# Patient Record
Sex: Male | Born: 1980 | Race: White | Hispanic: No | Marital: Married | Smoking: Former smoker
Health system: Southern US, Community
[De-identification: ages and names within clinical notes are randomized; demographics above are authoritative.]

## PROBLEM LIST (undated history)

## (undated) DIAGNOSIS — I729 Aneurysm of unspecified site: Secondary | ICD-10-CM

---

## 2006-08-11 ENCOUNTER — Encounter: Admission: RE | Admit: 2006-08-11 | Discharge: 2006-08-11 | Payer: Self-pay | Admitting: Family Medicine

## 2006-08-13 ENCOUNTER — Emergency Department (HOSPITAL_COMMUNITY): Admission: EM | Admit: 2006-08-13 | Discharge: 2006-08-14 | Payer: Self-pay | Admitting: Emergency Medicine

## 2007-12-11 IMAGING — CT CT HEAD W/O CM
1 series · 16 of 30 positions shown, 20 images · IV contrast (agent unspecified)
Comparison: 08/11/06 study.

CLINICAL DATA: Syncope.  History of right MCA aneurysm.
 HEAD CT WITHOUT CONTRAST:
TECHNIQUE: Contiguous axial images were obtained from the base of the skull through the vertex according to standard protocol without contrast.

[Series 2: brain · axial · 0.47mm/px · z∈[+134,+265]mm · 16 of 50 slices shown, 20 images]
[im 2/50  brain]
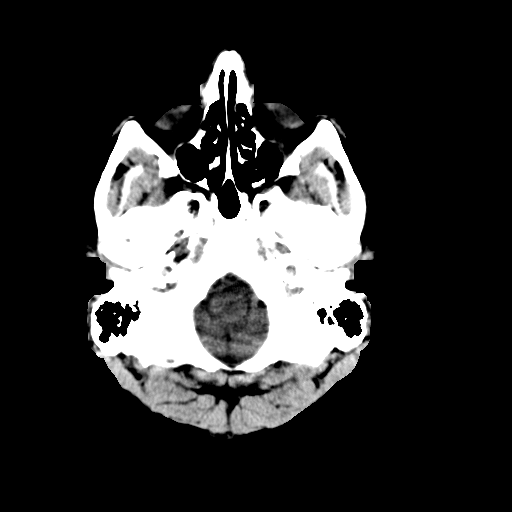
[im 2/50  bone]
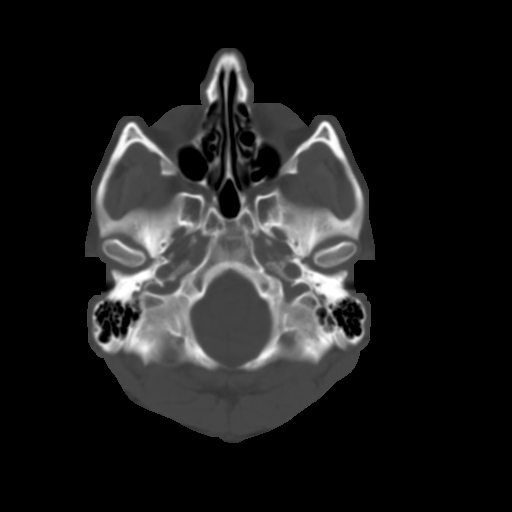
[im 6/50  brain]
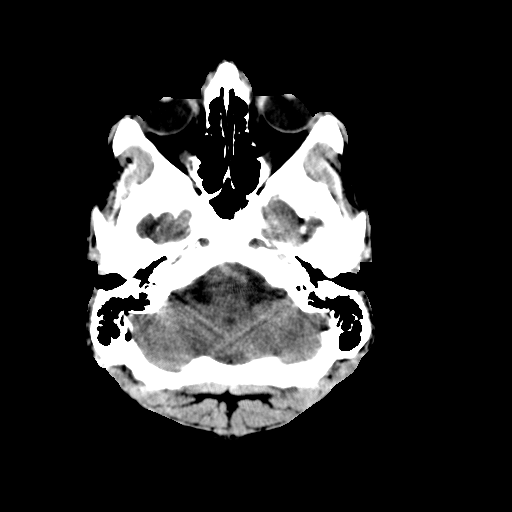
[im 9/50  brain]
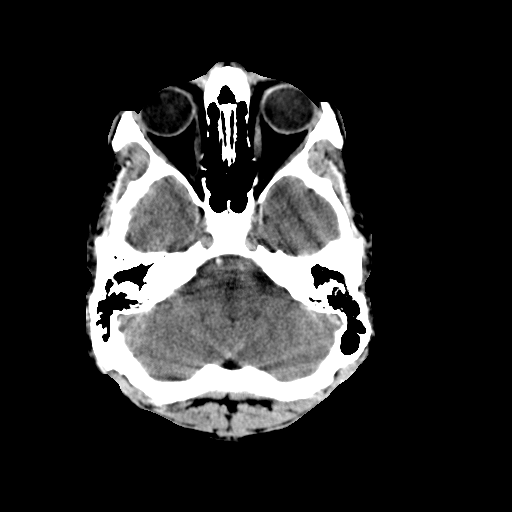
[im 12/50  brain]
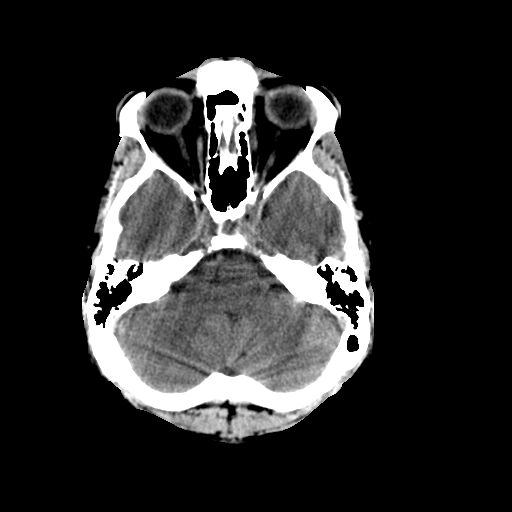
[im 14/50  brain]
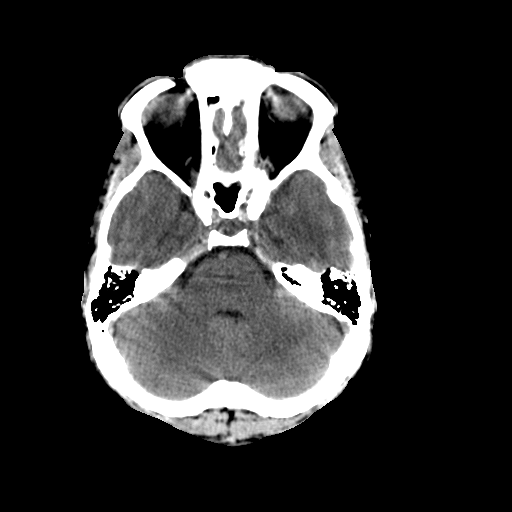
[im 14/50  bone]
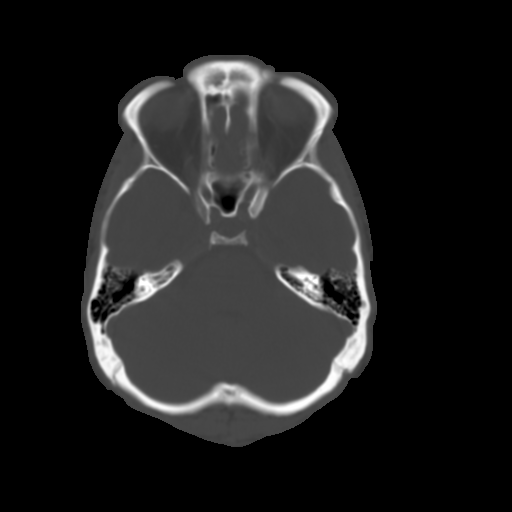
[im 17/50  brain]
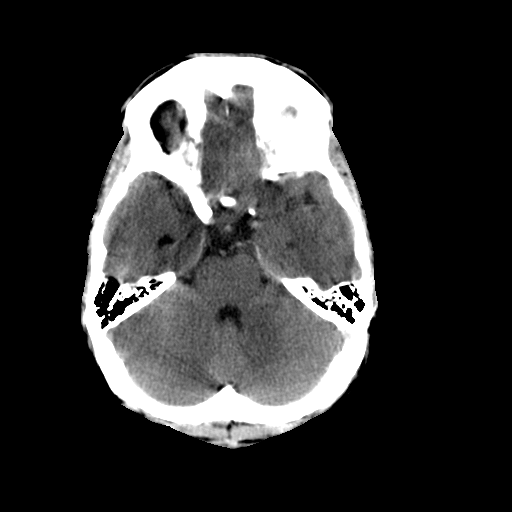
[im 21/50  brain]
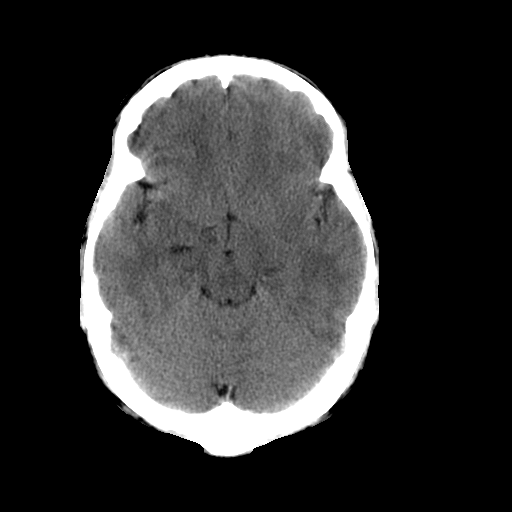
[im 24/50  brain]
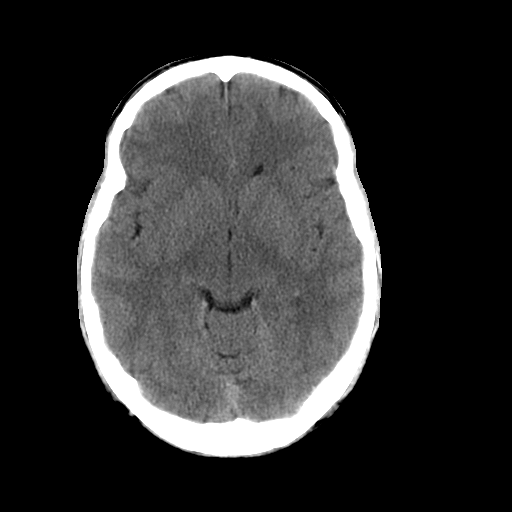
[im 26/50  brain]
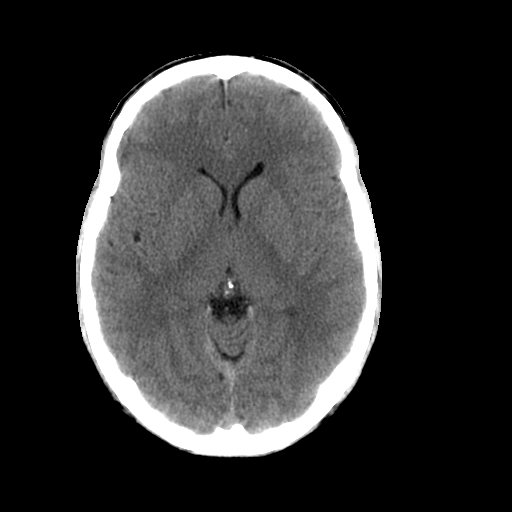
[im 26/50  bone]
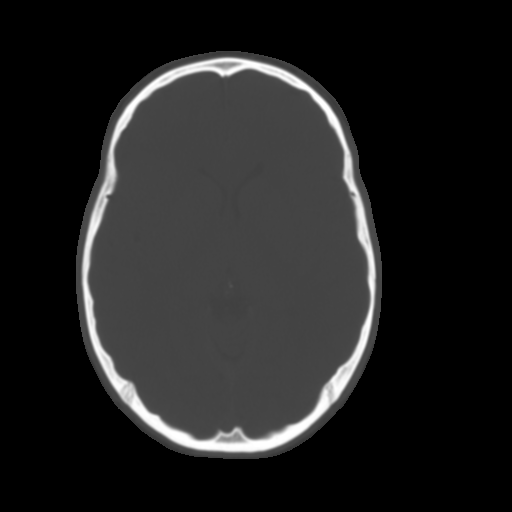
[im 29/50  brain]
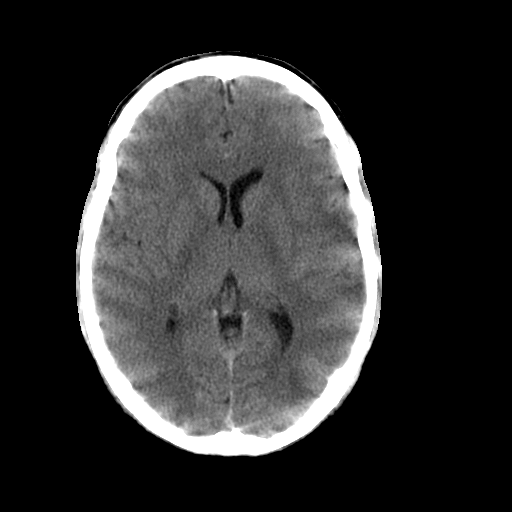
[im 33/50  brain]
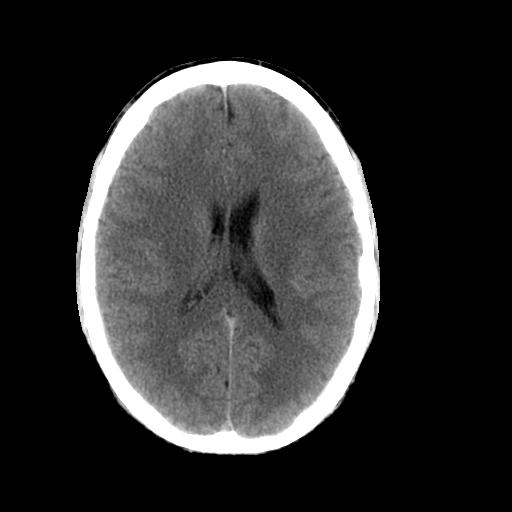
[im 36/50  brain]
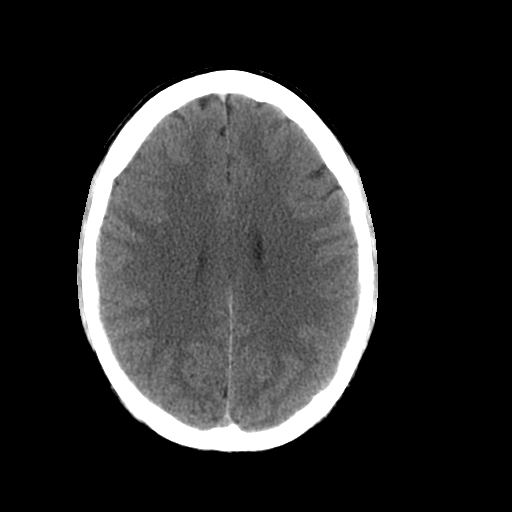
[im 38/50  brain]
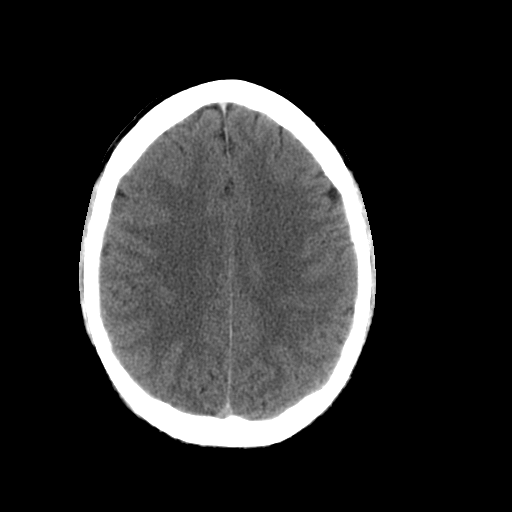
[im 38/50  bone]
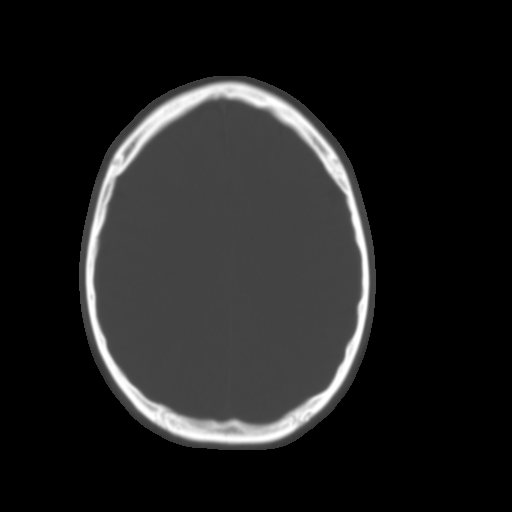
[im 41/50  brain]
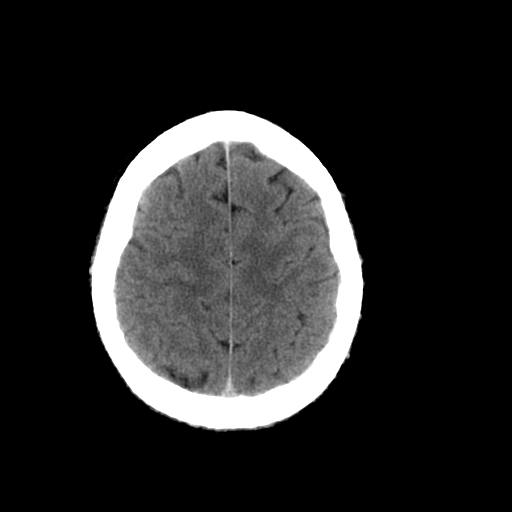
[im 44/50  brain]
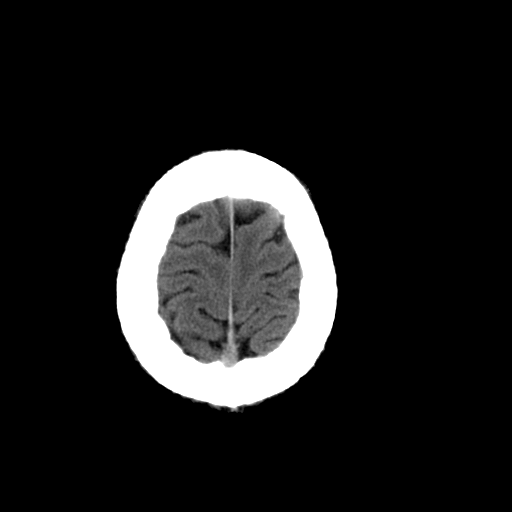
[im 48/50  brain]
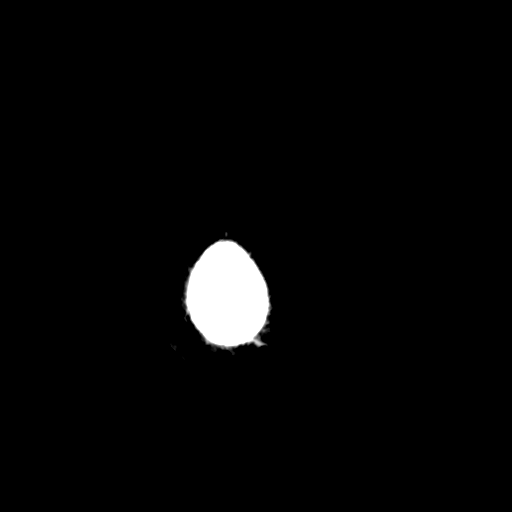

[16 of 30 positions shown; findings below may reference images not displayed]

FINDINGS: A right MCA aneurysm in the right sylvian fissure is unchanged since the prior study.  There is no evidence of hemorrhage.  No evidence of mass or mass effect, hydrocephalus, extraaxial fluid collection, midline shift, or infarct identified.  Please note that acute infarct may be occult on CT.
IMPRESSION: 1.  No evidence of acute intracranial abnormality. 
 2.  Right MCA aneurysm again identified, unchanged.

## 2015-10-18 ENCOUNTER — Emergency Department (HOSPITAL_COMMUNITY)
Admission: EM | Admit: 2015-10-18 | Discharge: 2015-10-18 | Disposition: A | Discharge status: Home / Self Care | Payer: Managed Care, Other (non HMO) | Attending: Emergency Medicine | Admitting: Emergency Medicine

## 2015-10-18 ENCOUNTER — Emergency Department (HOSPITAL_COMMUNITY): Payer: Managed Care, Other (non HMO)

## 2015-10-18 ENCOUNTER — Encounter (HOSPITAL_COMMUNITY): Payer: Self-pay | Admitting: Emergency Medicine

## 2015-10-18 DIAGNOSIS — Z8679 Personal history of other diseases of the circulatory system: Secondary | ICD-10-CM | POA: Insufficient documentation

## 2015-10-18 DIAGNOSIS — R42 Dizziness and giddiness: Secondary | ICD-10-CM | POA: Insufficient documentation

## 2015-10-18 DIAGNOSIS — R55 Syncope and collapse: Secondary | ICD-10-CM | POA: Diagnosis present

## 2015-10-18 DIAGNOSIS — F419 Anxiety disorder, unspecified: Secondary | ICD-10-CM | POA: Insufficient documentation

## 2015-10-18 DIAGNOSIS — R202 Paresthesia of skin: Secondary | ICD-10-CM | POA: Diagnosis not present

## 2015-10-18 DIAGNOSIS — Z87891 Personal history of nicotine dependence: Secondary | ICD-10-CM | POA: Insufficient documentation

## 2015-10-18 DIAGNOSIS — H9192 Unspecified hearing loss, left ear: Secondary | ICD-10-CM | POA: Diagnosis not present

## 2015-10-18 HISTORY — DX: Aneurysm of unspecified site: I72.9

## 2015-10-18 LAB — CBC
HCT: 44.5 % (ref 39.0–52.0)
Hemoglobin: 15.6 g/dL (ref 13.0–17.0)
MCH: 29.9 pg (ref 26.0–34.0)
MCHC: 35.1 g/dL (ref 30.0–36.0)
MCV: 85.2 fL (ref 78.0–100.0)
PLATELETS: 183 10*3/uL (ref 150–400)
RBC: 5.22 MIL/uL (ref 4.22–5.81)
RDW: 12.3 % (ref 11.5–15.5)
WBC: 5.8 10*3/uL (ref 4.0–10.5)

## 2015-10-18 LAB — COMPREHENSIVE METABOLIC PANEL
ALBUMIN: 4.1 g/dL (ref 3.5–5.0)
ALT: 30 U/L (ref 17–63)
AST: 22 U/L (ref 15–41)
Alkaline Phosphatase: 44 U/L (ref 38–126)
Anion gap: 13 (ref 5–15)
BUN: 11 mg/dL (ref 6–20)
CHLORIDE: 103 mmol/L (ref 101–111)
CO2: 23 mmol/L (ref 22–32)
Calcium: 9 mg/dL (ref 8.9–10.3)
Creatinine, Ser: 0.9 mg/dL (ref 0.61–1.24)
GFR calc Af Amer: >60 mL/min (ref >60)
Glucose, Bld: 122 mg/dL — ABNORMAL HIGH (ref 65–99)
POTASSIUM: 3.7 mmol/L (ref 3.5–5.1)
SODIUM: 139 mmol/L (ref 135–145)
TOTAL PROTEIN: 6.6 g/dL (ref 6.5–8.1)
Total Bilirubin: 0.7 mg/dL (ref 0.3–1.2)

## 2015-10-18 LAB — DIFFERENTIAL
BASOS ABS: 0 10*3/uL (ref 0.0–0.1)
BASOS PCT: 0 %
EOS ABS: 0.1 10*3/uL (ref 0.0–0.7)
Eosinophils Relative: 2 %
Lymphocytes Relative: 46 %
Lymphs Abs: 2.7 10*3/uL (ref 0.7–4.0)
MONOS PCT: 7 %
Monocytes Absolute: 0.4 10*3/uL (ref 0.1–1.0)
NEUTROS PCT: 45 %
Neutro Abs: 2.6 10*3/uL (ref 1.7–7.7)

## 2015-10-18 LAB — I-STAT CHEM 8, ED
BUN: 13 mg/dL (ref 6–20)
CALCIUM ION: 1.14 mmol/L (ref 1.12–1.23)
Chloride: 100 mmol/L — ABNORMAL LOW (ref 101–111)
Creatinine, Ser: 0.8 mg/dL (ref 0.61–1.24)
Glucose, Bld: 119 mg/dL — ABNORMAL HIGH (ref 65–99)
HCT: 49 % (ref 39.0–52.0)
Hemoglobin: 16.7 g/dL (ref 13.0–17.0)
Potassium: 3.5 mmol/L (ref 3.5–5.1)
SODIUM: 140 mmol/L (ref 135–145)
TCO2: 25 mmol/L (ref 0–100)

## 2015-10-18 LAB — I-STAT TROPONIN, ED: TROPONIN I, POC: 0 ng/mL (ref 0.00–0.08)

## 2015-10-18 LAB — APTT: APTT: 27 s (ref 24–37)

## 2015-10-18 LAB — PROTIME-INR
INR: 1.24 (ref 0.00–1.49)
Prothrombin Time: 15.7 seconds — ABNORMAL HIGH (ref 11.6–15.2)

## 2015-10-18 NOTE — Discharge Instructions (Signed)
It was our pleasure to provide your ER care today - we hope that you feel better.  Rest. Drink adequate fluids.  Follow up with neurologist in the coming week - see referral - call office tomorrow morning to arrange follow up appointment.  Return to ER right away if worse, new symptoms, severe dizziness, change in speech or vision, one-sided numbness or weakness, hearing loss, severe headache, other concern.      Dizziness Dizziness is a common problem. It is a feeling of unsteadiness or light-headedness. You may feel like you are about to faint. Dizziness can lead to injury if you stumble or fall. Anyone can become dizzy, but dizziness is more common in older adults. This condition can be caused by a number of things, including medicines, dehydration, or illness. HOME CARE INSTRUCTIONS Taking these steps may help with your condition: Eating and Drinking  Drink enough fluid to keep your urine clear or pale yellow. This helps to keep you from becoming dehydrated. Try to drink more clear fluids, such as water.  Do not drink alcohol.  Limit your caffeine intake if directed by your health care provider.  Limit your salt intake if directed by your health care provider. Activity  Avoid making quick movements.  Rise slowly from chairs and steady yourself until you feel okay.  In the morning, first sit up on the side of the bed. When you feel okay, stand slowly while you hold onto something until you know that your balance is fine.  Move your legs often if you need to stand in one place for a long time. Tighten and relax your muscles in your legs while you are standing.  Do not drive or operate heavy machinery if you feel dizzy.  Avoid bending down if you feel dizzy. Place items in your home so that they are easy for you to reach without leaning over. Lifestyle  Do not use any tobacco products, including cigarettes, chewing tobacco, or electronic cigarettes. If you need help quitting,  ask your health care provider.  Try to reduce your stress level, such as with yoga or meditation. Talk with your health care provider if you need help. General Instructions  Watch your dizziness for any changes.  Take medicines only as directed by your health care provider. Talk with your health care provider if you think that your dizziness is caused by a medicine that you are taking.  Tell a friend or a family member that you are feeling dizzy. If he or she notices any changes in your behavior, have this person call your health care provider.  Keep all follow-up visits as directed by your health care provider. This is important. SEEK MEDICAL CARE IF:  Your dizziness does not go away.  Your dizziness or light-headedness gets worse.  You feel nauseous.  You have reduced hearing.  You have new symptoms.  You are unsteady on your feet or you feel like the room is spinning. SEEK IMMEDIATE MEDICAL CARE IF:  You vomit or have diarrhea and are unable to eat or drink anything.  You have problems talking, walking, swallowing, or using your arms, hands, or legs.  You feel generally weak.  You are not thinking clearly or you have trouble forming sentences. It may take a friend or family member to notice this.  You have chest pain, abdominal pain, shortness of breath, or sweating.  Your vision changes.  You notice any bleeding.  You have a headache.  You have neck pain or  a stiff neck.  You have a fever.   This information is not intended to replace advice given to you by your health care provider. Make sure you discuss any questions you have with your health care provider.   Document Released: 12/24/2000 Document Revised: 11/14/2014 Document Reviewed: 06/26/2014 Elsevier Interactive Patient Education 2016 [employer]    Paresthesia Paresthesia is an abnormal burning or prickling sensation. This sensation is generally felt in the hands, arms, legs, or feet. However, it  may occur in any part of the body. Usually, it is not painful. The feeling may be described as:  Tingling or numbness.  Pins and needles.  Skin crawling.  Buzzing.  Limbs falling asleep.  Itching. Most people experience temporary (transient) paresthesia at some time in their lives. Paresthesia may occur when you breathe too quickly (hyperventilation). It can also occur without any apparent cause. Commonly, paresthesia occurs when pressure is placed on a nerve. The sensation quickly goes away after the pressure is removed. For some people, however, paresthesia is a long-lasting (chronic) condition that is caused by an underlying disorder. If you continue to have paresthesia, you may need further medical evaluation. HOME CARE INSTRUCTIONS Watch your condition for any changes. Taking the following actions may help to lessen any discomfort that you are feeling:  Avoid drinking alcohol.  Try acupuncture or massage to help relieve your symptoms.  Keep all follow-up visits as directed by your health care provider. This is important. SEEK MEDICAL CARE IF:  You continue to have episodes of paresthesia.  Your burning or prickling feeling gets worse when you walk.  You have pain, cramps, or dizziness.  You develop a rash. SEEK IMMEDIATE MEDICAL CARE IF:  You feel weak.  You have trouble walking or moving.  You have problems with speech, understanding, or vision.  You feel confused.  You cannot control your bladder or bowel movements.  You have numbness after an injury.  You faint.   This information is not intended to replace advice given to you by your health care provider. Make sure you discuss any questions you have with your health care provider.   Document Released: 06/20/2002 Document Revised: 11/14/2014 Document Reviewed: 06/26/2014 Elsevier Interactive Patient Education [employer]

## 2015-10-18 NOTE — ED Notes (Signed)
Patient not in room yet 

## 2015-10-18 NOTE — ED Notes (Signed)
Pt reports that he was watching television when he started to get dizzy, the left side of his face went numb and he lost hearing in his left ear. Pt reports that symptoms resolved. Pt alert x4 @ this time.

## 2015-10-18 NOTE — ED Provider Notes (Signed)
CSN: [id number]     Arrival date & time 10/18/15  1642 History   First MD Initiated Contact with Patient 10/18/15 2020     Chief Complaint  Patient presents with  . Near Syncope  . Numbness     (Consider location/radiation/quality/duration/timing/severity/associated sxs/prior Treatment) Patient is a 35 y.o. male presenting with near-syncope. The history is provided by the patient.  Near Syncope Pertinent negatives include no chest pain, no abdominal pain, no headaches and no shortness of breath.  Patient indicates was sitting, watching tv, when had onset dizziness. Described as room spinning sensation.  Pt notes remote hx vertigo.  Patient then starts talking about hx anxiety attacks, and appears very anxious, fidgety, visibily shaking on stretcher.  Then decreased hearing left ear, and face felt numb tingling.  Patient indicates he became aware he was breathing was very fast, and tried to pace around, and do breathing exercises, as he has done with prior anxiety attacks. Symptoms improved w slowing breathing.  Patient indicates lasted 2-3 minutes, then resolve completely. States in car had another episode lasting about 1 minute.  Currently, no numbness, no dizziness, no hearing loss or tinnitus.   Past Medical History  Diagnosis Date  . Aneurysm (HCC)     clipped ten plus years ago.    History reviewed. No pertinent past surgical history. No family history on file. Social History  Substance Use Topics  . Smoking status: Former Games developer  . Smokeless tobacco: None  . Alcohol Use: No    Review of Systems  Constitutional: Negative for fever and chills.  HENT: Negative for congestion, sore throat and tinnitus.   Eyes: Negative for visual disturbance.  Respiratory: Negative for shortness of breath.   Cardiovascular: Positive for near-syncope. Negative for chest pain.  Gastrointestinal: Negative for vomiting and abdominal pain.  Genitourinary: Negative for dysuria and flank pain.   Musculoskeletal: Negative for back pain and neck pain.  Skin: Negative for rash.  Neurological: Negative for speech difficulty, weakness and headaches.  Hematological: Does not bruise/bleed easily.  Psychiatric/Behavioral: Negative for confusion.      Allergies  Review of patient's allergies indicates no known allergies.  Home Medications   Prior to Admission medications   Not on File   BP 138/84 mmHg  Pulse 82  Temp(Src) 97.7 F (36.5 C) (Oral)  Resp 18  SpO2 96% Physical Exam  Constitutional: He is oriented to person, place, and time. He appears well-developed and well-nourished. No distress.  HENT:  Head: Atraumatic.  Mouth/Throat: Oropharynx is clear and moist.  Eyes: Conjunctivae and EOM are normal. Pupils are equal, round, and reactive to light. No scleral icterus.  Neck: Neck supple. No tracheal deviation present.  No bruits  Cardiovascular: Normal rate, regular rhythm, normal heart sounds and intact distal pulses.  Exam reveals no gallop and no friction rub.   No murmur heard. Pulmonary/Chest: Effort normal and breath sounds normal. No accessory muscle usage. No respiratory distress.  Abdominal: Soft. Bowel sounds are normal. He exhibits no distension. There is no tenderness.  Genitourinary:  No cva tenderness  Musculoskeletal: Normal range of motion. He exhibits no edema.  Neurological: He is alert and oriented to person, place, and time. No cranial nerve deficit.  Motor intact bil. stre 5/5. sens grossly intact. No pronator drift, steady gait.     Skin: Skin is warm and dry. No rash noted. He is not diaphoretic.  Psychiatric:  Anxious.   Nursing note and vitals reviewed.   ED Course  Procedures (  including critical care time) Labs Review   Results for orders placed or performed during the hospital encounter of 10/18/15  Protime-INR  Result Value Ref Range   Prothrombin Time 15.7 (H) 11.6 - 15.2 seconds   INR 1.24 0.00 - 1.49  APTT  Result Value  Ref Range   aPTT 27 24 - 37 seconds  CBC  Result Value Ref Range   WBC 5.8 4.0 - 10.5 K/uL   RBC 5.22 4.22 - 5.81 MIL/uL   Hemoglobin 15.6 13.0 - 17.0 g/dL   HCT 59.5 63.8 - 75.6 %   MCV 85.2 78.0 - 100.0 fL   MCH 29.9 26.0 - 34.0 pg   MCHC 35.1 30.0 - 36.0 g/dL   RDW 43.3 29.5 - 18.8 %   Platelets 183 150 - 400 K/uL  Differential  Result Value Ref Range   Neutrophils Relative % 45 %   Neutro Abs 2.6 1.7 - 7.7 K/uL   Lymphocytes Relative 46 %   Lymphs Abs 2.7 0.7 - 4.0 K/uL   Monocytes Relative 7 %   Monocytes Absolute 0.4 0.1 - 1.0 K/uL   Eosinophils Relative 2 %   Eosinophils Absolute 0.1 0.0 - 0.7 K/uL   Basophils Relative 0 %   Basophils Absolute 0.0 0.0 - 0.1 K/uL  Comprehensive metabolic panel  Result Value Ref Range   Sodium 139 135 - 145 mmol/L   Potassium 3.7 3.5 - 5.1 mmol/L   Chloride 103 101 - 111 mmol/L   CO2 23 22 - 32 mmol/L   Glucose, Bld 122 (H) 65 - 99 mg/dL   BUN 11 6 - 20 mg/dL   Creatinine, Ser 4.16 0.61 - 1.24 mg/dL   Calcium 9.0 8.9 - 60.6 mg/dL   Total Protein 6.6 6.5 - 8.1 g/dL   Albumin 4.1 3.5 - 5.0 g/dL   AST 22 15 - 41 U/L   ALT 30 17 - 63 U/L   Alkaline Phosphatase 44 38 - 126 U/L   Total Bilirubin 0.7 0.3 - 1.2 mg/dL   GFR calc non Af Amer >60 >60 mL/min   GFR calc Af Amer >60 >60 mL/min   Anion gap 13 5 - 15  I-stat troponin, ED (not at [facility], [facility]  Result Value Ref Range   Troponin i, poc 0.00 0.00 - 0.08 ng/mL   Comment 3          I-Stat Chem 8, ED  (not at [facility], [facility])  Result Value Ref Range   Sodium 140 135 - 145 mmol/L   Potassium 3.5 3.5 - 5.1 mmol/L   Chloride 100 (L) 101 - 111 mmol/L   BUN 13 6 - 20 mg/dL   Creatinine, Ser 3.01 0.61 - 1.24 mg/dL   Glucose, Bld 601 (H) 65 - 99 mg/dL   Calcium, Ion 0.93 2.35 - 1.23 mmol/L   TCO2 25 0 - 100 mmol/L   Hemoglobin 16.7 13.0 - 17.0 g/dL   HCT 57.3 22.0 - 25.4 %   Ct Head Wo Contrast  10/18/2015  CLINICAL DATA:  Three episodes of dizziness today, LEFT facial numbness, no  hearing in LEFT ear, prior aneurysm clipping, former smoker EXAM: CT HEAD WITHOUT CONTRAST TECHNIQUE: Contiguous axial images were obtained from the base of the skull through the vertex without intravenous contrast. COMPARISON:  08/13/2006 FINDINGS: Beam hardening artifacts from aneurysm clip at RIGHT sylvian fissure. Normal ventricular morphology. No midline shift or mass effect. Otherwise normal appearance of brain parenchyma. No intracranial hemorrhage, mass lesion, or  evidence acute infarction. No extra-axial fluid collections. Postsurgical changes from RIGHT frontotemporal craniotomy. No acute bone or sinus abnormalities. IMPRESSION: Postsurgical changes from aneurysm clipping at RIGHT sylvian fissure. No acute intracranial abnormalities. Electronically Signed   By: Ulyses SouthwardMark  Boles M.D.   On: 10/18/2015 17:38       I have personally reviewed and evaluated these images and lab results as part of my medical decision-making.   EKG Interpretation   Date/Time:  Thursday October 18 2015 16:50:42 EDT Ventricular Rate:  76 PR Interval:  166 QRS Duration: 82 QT Interval:  356 QTC Calculation: 400 R Axis:   82 Text Interpretation:  Normal sinus rhythm with sinus arrhythmia Normal ECG  No significant change since last tracing Confirmed by Denton LankSTEINL  MD, Caryn BeeKEVIN  ([id number]) on 10/18/2015 8:55:21 PM      MDM   Labs.  Reviewed nursing notes and prior charts for additional history.   Symptoms have completely resolved.  Patient denies numbness/weakness, or any loss of normal functional ability. No headache.  Patient remains asymptomatic, and currently appears stable for d/c.  Recommend close neurology f/u - will provide referral - return precautions provided.        Cathren LaineKevin Mackenize Delgadillo, MD 10/18/15 2132

## 2015-10-18 NOTE — ED Notes (Signed)
Dr. Steinl at the bedside.  

## 2017-02-14 IMAGING — CT CT HEAD W/O CM
1 series · 16 of 30 positions shown, 20 images · non-contrast
Comparison: 08/13/2006

CLINICAL DATA: Three episodes of dizziness today, LEFT facial
numbness, no hearing in LEFT ear, prior aneurysm clipping, former
smoker

EXAM:
CT HEAD WITHOUT CONTRAST
TECHNIQUE: Contiguous axial images were obtained from the base of the skull
through the vertex without intravenous contrast.

[Series 2: head 5.0 h30s · axial · 0.46mm/px · z∈[-137,+8]mm · 16 of 33 slices shown, 20 images]
[im 2/33  brain]
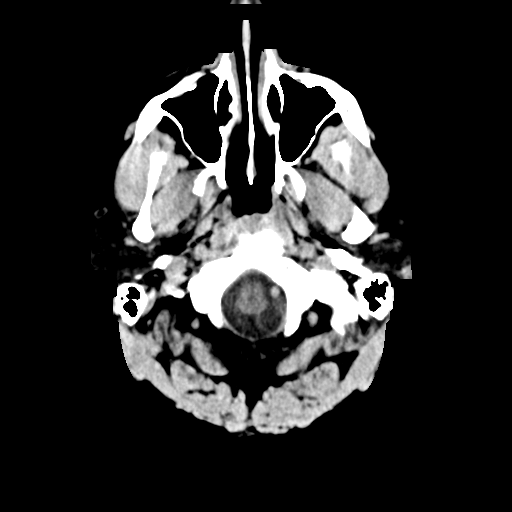
[im 2/33  bone]
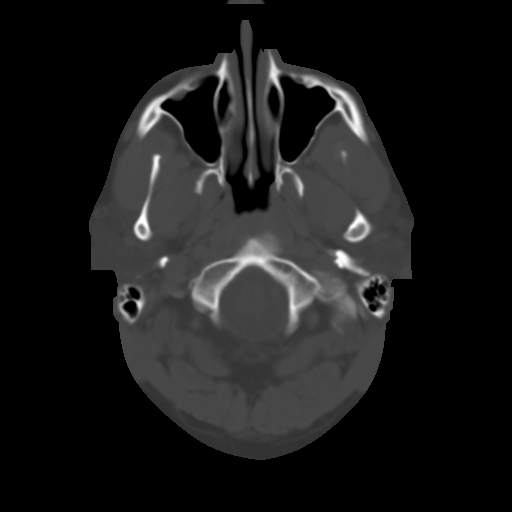
[im 4/33  brain]
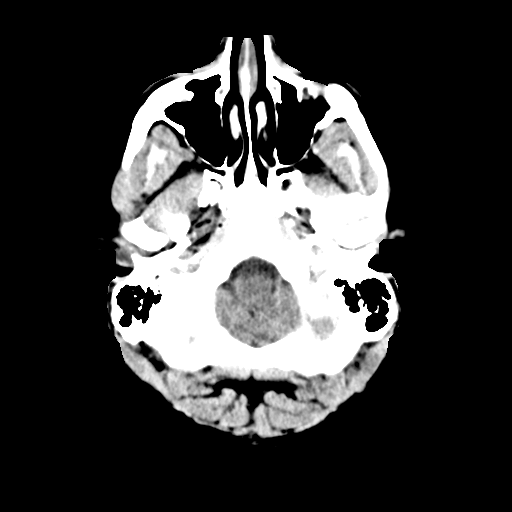
[im 6/33  brain]
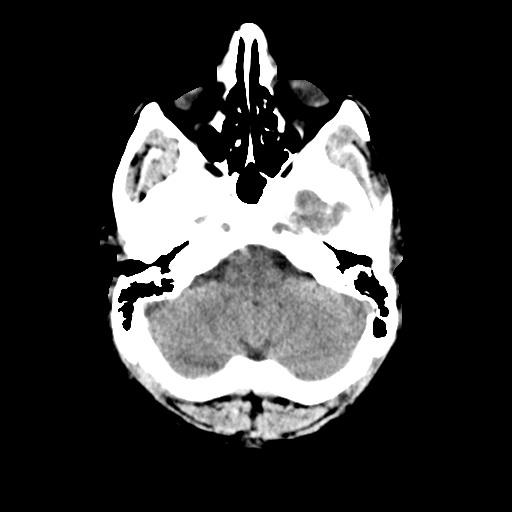
[im 8/33  brain]
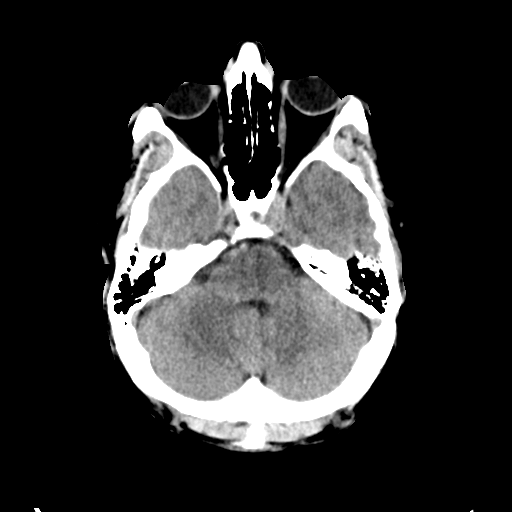
[im 9/33  brain]
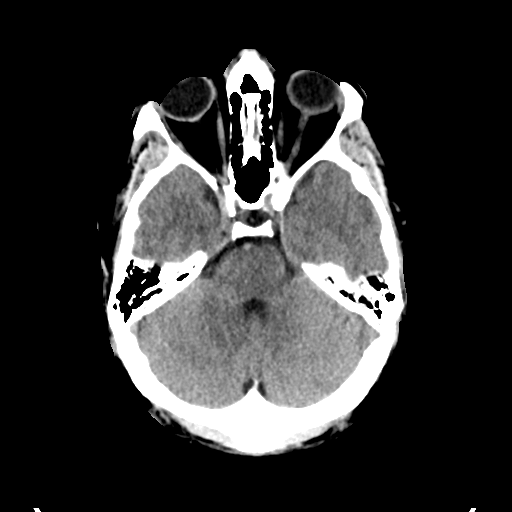
[im 9/33  bone]
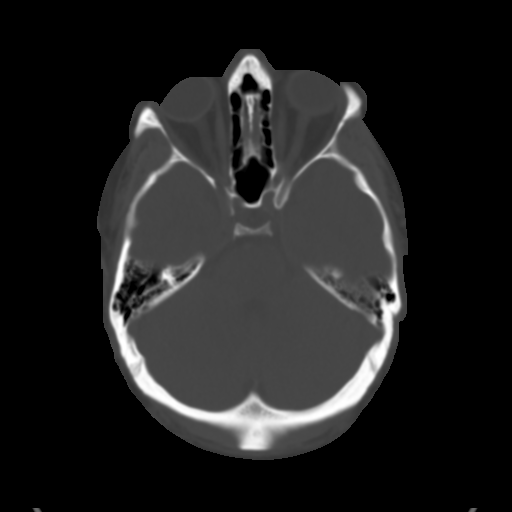
[im 12/33  brain]
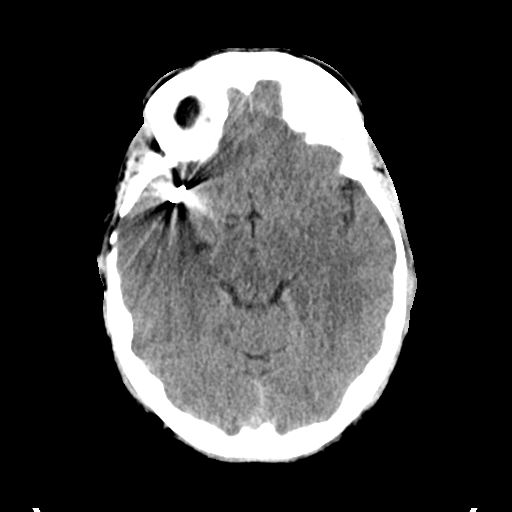
[im 14/33  brain]
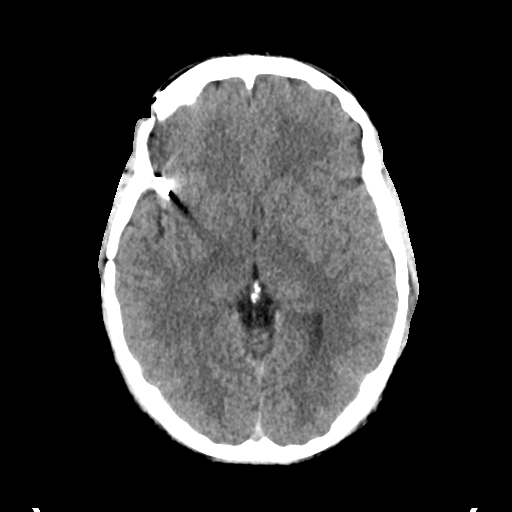
[im 16/33  brain]
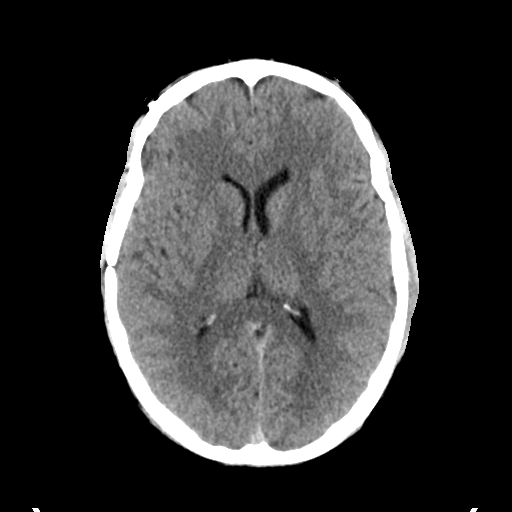
[im 17/33  brain]
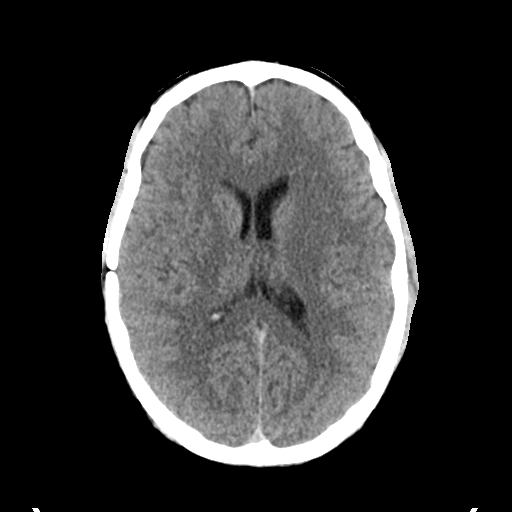
[im 17/33  bone]
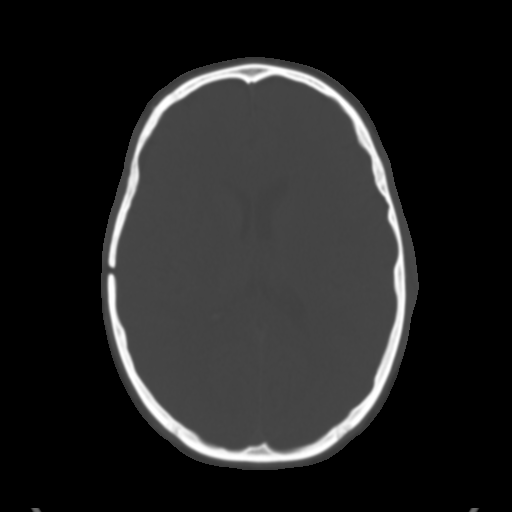
[im 19/33  brain]
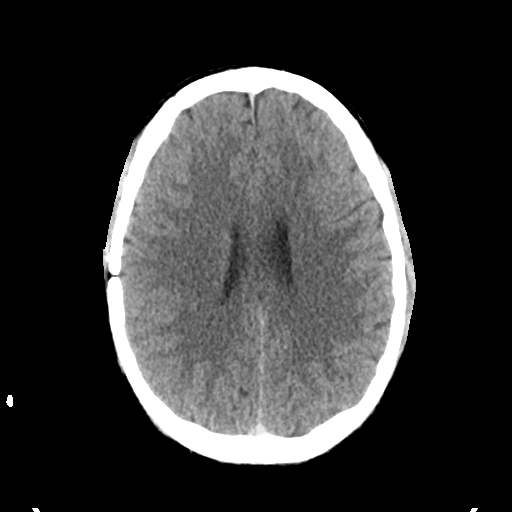
[im 21/33  brain]
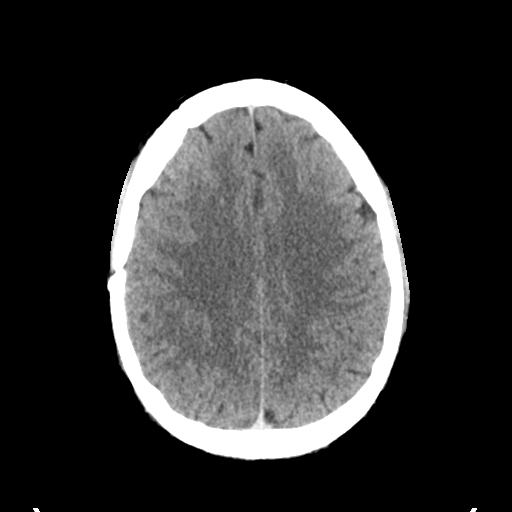
[im 24/33  brain]
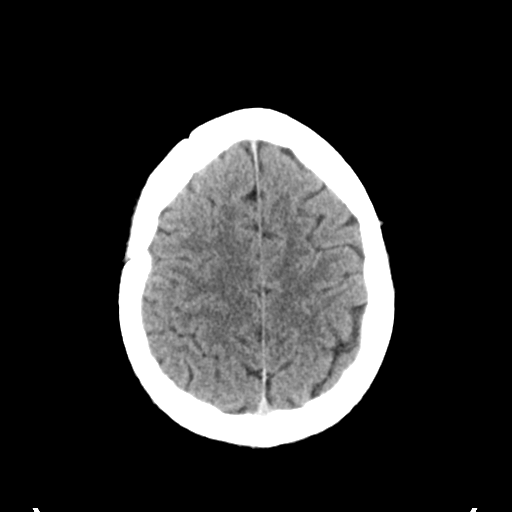
[im 25/33  brain]
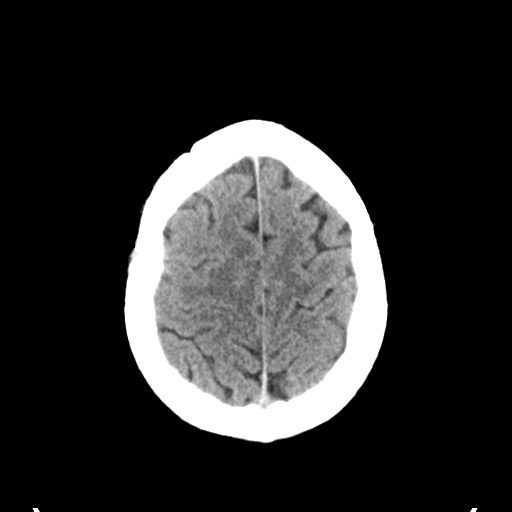
[im 25/33  bone]
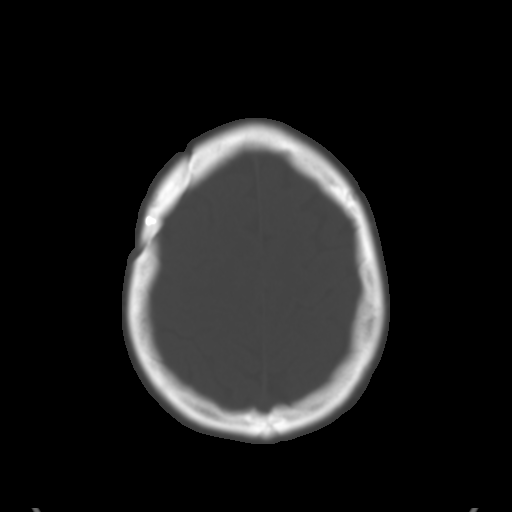
[im 27/33  brain]
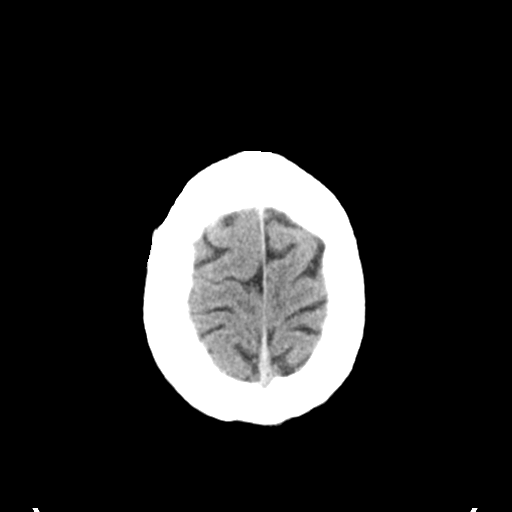
[im 29/33  brain]
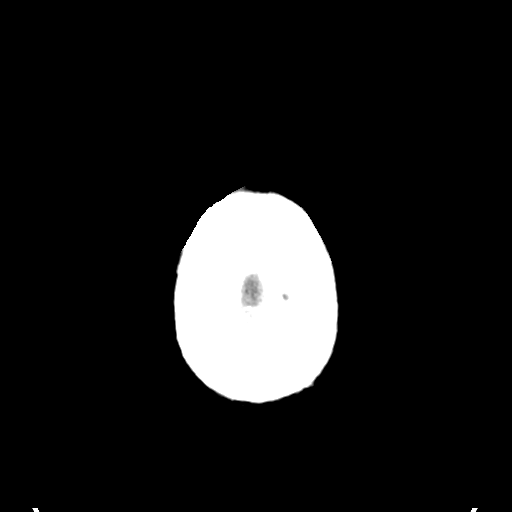
[im 31/33  brain]
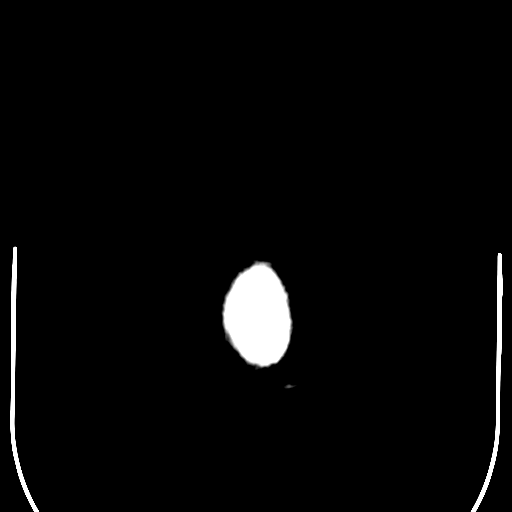

[16 of 30 positions shown; findings below may reference images not displayed]

FINDINGS: Beam hardening artifacts from aneurysm clip at RIGHT sylvian
fissure.

Normal ventricular morphology.

No midline shift or mass effect.

Otherwise normal appearance of brain parenchyma.

No intracranial hemorrhage, mass lesion, or evidence acute
infarction.

No extra-axial fluid collections.

Postsurgical changes from RIGHT frontotemporal craniotomy.

No acute bone or sinus abnormalities.
IMPRESSION: Postsurgical changes from aneurysm clipping at RIGHT sylvian
fissure.

No acute intracranial abnormalities.

## 2022-04-08 ENCOUNTER — Other Ambulatory Visit: Payer: Self-pay | Admitting: Orthopaedic Surgery

## 2022-04-08 DIAGNOSIS — M5416 Radiculopathy, lumbar region: Secondary | ICD-10-CM

## 2022-04-10 ENCOUNTER — Ambulatory Visit
Admission: RE | Admit: 2022-04-10 | Discharge: 2022-04-10 | Disposition: A | Discharge status: Home / Self Care | Payer: 59 | Source: Ambulatory Visit | Attending: Orthopaedic Surgery | Admitting: Orthopaedic Surgery

## 2022-04-10 DIAGNOSIS — M5416 Radiculopathy, lumbar region: Secondary | ICD-10-CM
